# Patient Record
Sex: Male | Born: 1950 | Race: White | Hispanic: No | Marital: Married | Smoking: Former smoker
Health system: Southern US, Community
[De-identification: ages and names within clinical notes are randomized; demographics above are authoritative.]

## PROBLEM LIST (undated history)

## (undated) DIAGNOSIS — I209 Angina pectoris, unspecified: Secondary | ICD-10-CM

## (undated) DIAGNOSIS — E785 Hyperlipidemia, unspecified: Secondary | ICD-10-CM

## (undated) DIAGNOSIS — I10 Essential (primary) hypertension: Secondary | ICD-10-CM

## (undated) DIAGNOSIS — K219 Gastro-esophageal reflux disease without esophagitis: Secondary | ICD-10-CM

## (undated) DIAGNOSIS — I2510 Atherosclerotic heart disease of native coronary artery without angina pectoris: Secondary | ICD-10-CM

## (undated) HISTORY — PX: HALLUX VALGUS CORRECTION: SUR315

## (undated) HISTORY — PX: CARDIAC CATHETERIZATION: SHX172

## (undated) HISTORY — PX: APPENDECTOMY: SHX54

---

## 2018-03-05 ENCOUNTER — Encounter: Payer: Self-pay | Admitting: *Deleted

## 2018-03-08 ENCOUNTER — Ambulatory Visit: Payer: BLUE CROSS/BLUE SHIELD | Admitting: Anesthesiology

## 2018-03-08 ENCOUNTER — Encounter: Payer: Self-pay | Admitting: *Deleted

## 2018-03-08 ENCOUNTER — Encounter
Admission: RE | Disposition: A | Discharge status: Home / Self Care | Payer: Self-pay | Source: Ambulatory Visit | Attending: Unknown Physician Specialty

## 2018-03-08 ENCOUNTER — Other Ambulatory Visit: Payer: Self-pay

## 2018-03-08 ENCOUNTER — Ambulatory Visit
Admission: RE | Admit: 2018-03-08 | Discharge: 2018-03-08 | Disposition: A | Discharge status: Home / Self Care | Payer: BLUE CROSS/BLUE SHIELD | Source: Ambulatory Visit | Attending: Unknown Physician Specialty | Admitting: Unknown Physician Specialty

## 2018-03-08 DIAGNOSIS — I10 Essential (primary) hypertension: Secondary | ICD-10-CM | POA: Diagnosis not present

## 2018-03-08 DIAGNOSIS — K5730 Diverticulosis of large intestine without perforation or abscess without bleeding: Secondary | ICD-10-CM | POA: Diagnosis not present

## 2018-03-08 DIAGNOSIS — Z87891 Personal history of nicotine dependence: Secondary | ICD-10-CM | POA: Diagnosis not present

## 2018-03-08 DIAGNOSIS — Z79899 Other long term (current) drug therapy: Secondary | ICD-10-CM | POA: Insufficient documentation

## 2018-03-08 DIAGNOSIS — Z1211 Encounter for screening for malignant neoplasm of colon: Secondary | ICD-10-CM | POA: Insufficient documentation

## 2018-03-08 DIAGNOSIS — K219 Gastro-esophageal reflux disease without esophagitis: Secondary | ICD-10-CM | POA: Insufficient documentation

## 2018-03-08 DIAGNOSIS — K640 First degree hemorrhoids: Secondary | ICD-10-CM | POA: Insufficient documentation

## 2018-03-08 DIAGNOSIS — K2270 Barrett's esophagus without dysplasia: Secondary | ICD-10-CM | POA: Insufficient documentation

## 2018-03-08 DIAGNOSIS — D120 Benign neoplasm of cecum: Secondary | ICD-10-CM | POA: Insufficient documentation

## 2018-03-08 DIAGNOSIS — E785 Hyperlipidemia, unspecified: Secondary | ICD-10-CM | POA: Diagnosis not present

## 2018-03-08 DIAGNOSIS — Z7982 Long term (current) use of aspirin: Secondary | ICD-10-CM | POA: Diagnosis not present

## 2018-03-08 DIAGNOSIS — I2510 Atherosclerotic heart disease of native coronary artery without angina pectoris: Secondary | ICD-10-CM | POA: Diagnosis not present

## 2018-03-08 HISTORY — PX: ESOPHAGOGASTRODUODENOSCOPY (EGD) WITH PROPOFOL: SHX5813

## 2018-03-08 HISTORY — DX: Atherosclerotic heart disease of native coronary artery without angina pectoris: I25.10

## 2018-03-08 HISTORY — DX: Essential (primary) hypertension: I10

## 2018-03-08 HISTORY — DX: Angina pectoris, unspecified: I20.9

## 2018-03-08 HISTORY — DX: Gastro-esophageal reflux disease without esophagitis: K21.9

## 2018-03-08 HISTORY — PX: COLONOSCOPY WITH PROPOFOL: SHX5780

## 2018-03-08 HISTORY — DX: Hyperlipidemia, unspecified: E78.5

## 2018-03-08 SURGERY — COLONOSCOPY WITH PROPOFOL
Anesthesia: General

## 2018-03-08 MED ORDER — MIDAZOLAM HCL 5 MG/5ML IJ SOLN
INTRAMUSCULAR | Status: DC | PRN
  Administered 2018-03-08: 2 mg via INTRAVENOUS

## 2018-03-08 MED ORDER — FENTANYL CITRATE (PF) 100 MCG/2ML IJ SOLN
INTRAMUSCULAR | Status: DC | PRN
  Administered 2018-03-08 (×2): 50 ug via INTRAVENOUS

## 2018-03-08 MED ORDER — SODIUM CHLORIDE 0.9 % IV SOLN
INTRAVENOUS | Status: DC
  Administered 2018-03-08: 13:00:00 via INTRAVENOUS

## 2018-03-08 MED ORDER — LIDOCAINE HCL (PF) 2 % IJ SOLN
INTRAMUSCULAR | Status: DC | PRN
  Administered 2018-03-08: 80 mg

## 2018-03-08 MED ORDER — GLYCOPYRROLATE 0.2 MG/ML IJ SOLN
INTRAMUSCULAR | Status: DC | PRN
  Administered 2018-03-08: 0.2 mg via INTRAVENOUS

## 2018-03-08 MED ORDER — PROPOFOL 500 MG/50ML IV EMUL
INTRAVENOUS | Status: AC
  Filled 2018-03-08: qty 50

## 2018-03-08 MED ORDER — LIDOCAINE HCL (PF) 2 % IJ SOLN
INTRAMUSCULAR | Status: AC
Start: 2018-03-08 — End: ?
  Filled 2018-03-08: qty 10

## 2018-03-08 MED ORDER — SODIUM CHLORIDE 0.9 % IV SOLN: INTRAVENOUS | Status: DC

## 2018-03-08 MED ORDER — MIDAZOLAM HCL 2 MG/2ML IJ SOLN
INTRAMUSCULAR | Status: AC
  Filled 2018-03-08: qty 2

## 2018-03-08 MED ORDER — GLYCOPYRROLATE 0.2 MG/ML IJ SOLN
INTRAMUSCULAR | Status: AC
  Filled 2018-03-08: qty 1

## 2018-03-08 MED ORDER — PROPOFOL 500 MG/50ML IV EMUL
INTRAVENOUS | Status: DC | PRN
  Administered 2018-03-08: 50 ug/kg/min via INTRAVENOUS

## 2018-03-08 MED ORDER — PROPOFOL 10 MG/ML IV BOLUS
INTRAVENOUS | Status: DC | PRN
  Administered 2018-03-08: 20 mg via INTRAVENOUS
  Administered 2018-03-08: 30 mg via INTRAVENOUS

## 2018-03-08 MED ORDER — FENTANYL CITRATE (PF) 100 MCG/2ML IJ SOLN
INTRAMUSCULAR | Status: AC
  Filled 2018-03-08: qty 2

## 2018-03-08 NOTE — Anesthesia Postprocedure Evaluation (Signed)
Anesthesia Post Note  Patient: Derrick Monroe  Procedure(s) Performed: COLONOSCOPY WITH PROPOFOL (N/A ) ESOPHAGOGASTRODUODENOSCOPY (EGD) WITH PROPOFOL (N/A )  Patient location during evaluation: Endoscopy Anesthesia Type: General Level of consciousness: awake and alert Pain management: pain level controlled Vital Signs Assessment: post-procedure vital signs reviewed and stable Respiratory status: spontaneous breathing, nonlabored ventilation, respiratory function stable and patient connected to nasal cannula oxygen Cardiovascular status: blood pressure returned to baseline and stable Postop Assessment: no apparent nausea or vomiting Anesthetic complications: no     Last Vitals:  Vitals:   03/08/18 1350 03/08/18 1400  BP: 135/75 127/89  Pulse: 64 (!) 57  Resp: 14 17  Temp:    SpO2: 98% 98%    Last Pain:  Vitals:   03/08/18 1400  TempSrc:   PainSc: 0-No pain                 Martha Clan

## 2018-03-08 NOTE — Anesthesia Post-op Follow-up Note (Signed)
Anesthesia QCDR form completed.        

## 2018-03-08 NOTE — Op Note (Signed)
[name] Gastroenterology Patient Name: Derrick Monroe Procedure Date: 03/08/2018 12:06 PM MRN: [id number] Account #: [id number] Date of Birth: 12/03/1950 Admit Type: Outpatient Age: 67 Room: [facility] ENDO ROOM 3 Gender: Male Note Status: Finalized Procedure:            Colonoscopy Indications:          Screening for colorectal malignant neoplasm Providers:            Manya Silvas, MD Medicines:            Propofol per Anesthesia Complications:        No immediate complications. Procedure:            Pre-Anesthesia Assessment:                       - After reviewing the risks and benefits, the patient                        was deemed in satisfactory condition to undergo the                        procedure.                       After obtaining informed consent, the colonoscope was                        passed under direct vision. Throughout the procedure,                        the patient's blood pressure, pulse, and oxygen                        saturations were monitored continuously. The                        Colonoscope was introduced through the anus and                        advanced to the the cecum, identified by appendiceal                        orifice and ileocecal valve. The colonoscopy was                        performed without difficulty. The patient tolerated the                        procedure well. The quality of the bowel preparation                        was excellent. Findings:      A small polyp was found in the cecum. The polyp was sessile. The polyp       was removed with a hot snare. Resection and retrieval were complete.      A patchy area of moderately erythematous mucosa was found in the       recto-sigmoid colon and in the sigmoid colon.      Internal hemorrhoids were found during endoscopy. The hemorrhoids were       small and Grade I (internal hemorrhoids that do not prolapse).  Many small-mouthed diverticula were  found in the sigmoid colon and       descending colon. Impression:           - One small polyp in the cecum, removed with a hot                        snare. Resected and retrieved.                       - Erythematous mucosa in the recto-sigmoid colon and in                        the sigmoid colon.                       - Internal hemorrhoids.                       - Diverticulosis in the sigmoid colon and in the                        descending colon. Recommendation:       - Await pathology results. Manya Silvas, MD 03/08/2018 1:41:57 PM This report has been signed electronically. Number of Addenda: 0 Note Initiated On: 03/08/2018 12:06 PM Scope Withdrawal Time: 0 hours 11 minutes 35 seconds  Total Procedure Duration: 0 hours 14 minutes 32 seconds       [facility]

## 2018-03-08 NOTE — H&P (Signed)
Primary Care Physician:  Adin Hector, MD Primary Gastroenterologist:  Dr. Vira Agar  Pre-Procedure History & Physical: HPI:  Derrick Monroe is a 67 y.o. male is here for an endoscopy and colonoscopy.  Colon cancer screening and GERD.   Past Medical History:  Diagnosis Date  . Anginal pain ([place])   . Coronary artery disease   . GERD (gastroesophageal reflux disease)   . Hyperlipidemia   . Hypertension     Past Surgical History:  Procedure Laterality Date  . APPENDECTOMY    . CARDIAC CATHETERIZATION    . HALLUX VALGUS CORRECTION      Prior to Admission medications   Medication Sig Start Date End Date Taking? Authorizing Provider  aspirin EC 81 MG tablet Take 81 mg by mouth daily.   Yes [provider]  finasteride (PROSCAR) 5 MG tablet Take 5 mg by mouth daily.   Yes [provider]  Glucosamine Sulfate 1000 MG CAPS Take by mouth.   Yes [provider]  metoprolol succinate (TOPROL-XL) 25 MG 24 hr tablet Take 25 mg by mouth daily.   Yes [provider]  Multiple Vitamin (MULTIVITAMIN) tablet Take 1 tablet by mouth daily.   Yes [provider]  OMEGA-3 FATTY ACIDS PO Take 1,200 mg by mouth daily.   Yes [provider]  pantoprazole (PROTONIX) 40 MG tablet Take 40 mg by mouth daily.   Yes [provider]  rosuvastatin (CRESTOR) 40 MG tablet Take 40 mg by mouth daily.   Yes [provider]  polyethylene glycol powder (GLYCOLAX/MIRALAX) powder Take 1 Container by mouth once.    [provider]    Allergies as of 01/18/2018  . (Not on File)    History reviewed. No pertinent family history.  Social History   Socioeconomic History  . Marital status: Married    Spouse name: Not on file  . Number of children: Not on file  . Years of education: Not on file  . Highest education level: Not on file  Occupational History  . Not on file  Social Needs  . Financial resource strain: Not on file  .  Food insecurity:    Worry: Not on file    Inability: Not on file  . Transportation needs:    Medical: Not on file    Non-medical: Not on file  Tobacco Use  . Smoking status: Former Research scientist (life sciences)  . Smokeless tobacco: Never Used  Substance and Sexual Activity  . Alcohol use: Yes    Alcohol/week: 1.2 oz    Types: 2 Cans of beer per week  . Drug use: Never  . Sexual activity: Yes  Lifestyle  . Physical activity:    Days per week: Not on file    Minutes per session: Not on file  . Stress: Not on file  Relationships  . Social connections:    Talks on phone: Not on file    Gets together: Not on file    Attends religious service: Not on file    Active member of club or organization: Not on file    Attends meetings of clubs or organizations: Not on file    Relationship status: Not on file  . Intimate partner violence:    Fear of current or ex partner: Not on file    Emotionally abused: Not on file    Physically abused: Not on file    Forced sexual activity: Not on file  Other Topics Concern  . Not on file  Social History Narrative  . Not on file    Review of Systems: See HPI, otherwise negative ROS  Physical Exam: BP (!) 145/73   Pulse (!) 54   Temp (!) 97.4 F (36.3 C) (Tympanic)   Resp 16   Ht 6' (1.829 m)   Wt 81.6 kg (180 lb)   SpO2 98%   BMI 24.41 kg/m  General:   Alert,  pleasant and cooperative in NAD Head:  Normocephalic and atraumatic. Neck:  Supple; no masses or thyromegaly. Lungs:  Clear throughout to auscultation.    Heart:  Regular rate and rhythm. Abdomen:  Soft, nontender and nondistended. Normal bowel sounds, without guarding, and without rebound.   Neurologic:  Alert and  oriented x4;  grossly normal neurologically.  Impression/Plan: Derrick Monroe is here for an endoscopy and colonoscopy to be performed for colon cancer screening and GERD  Risks, benefits, limitations, and alternatives regarding  endoscopy and colonoscopy have been reviewed with the  patient.  Questions have been answered.  All parties agreeable.   Gaylyn Cheers, MD  03/08/2018, 1:05 PM

## 2018-03-08 NOTE — Transfer of Care (Signed)
Immediate Anesthesia Transfer of Care Note  Patient: Derrick Monroe  Procedure(s) Performed: COLONOSCOPY WITH PROPOFOL (N/A ) ESOPHAGOGASTRODUODENOSCOPY (EGD) WITH PROPOFOL (N/A )  Patient Location: PACU  Anesthesia Type:General  Level of Consciousness: sedated  Airway & Oxygen Therapy: Patient Spontanous Breathing and Patient connected to nasal cannula oxygen  Post-op Assessment: Report given to RN and Post -op Vital signs reviewed and stable  Post vital signs: Reviewed and stable  Last Vitals:  Vitals Value Taken Time  BP 119/70 03/08/2018  1:39 PM  Temp    Pulse 64 03/08/2018  1:39 PM  Resp 16 03/08/2018  1:39 PM  SpO2 97 % 03/08/2018  1:39 PM  Vitals shown include unvalidated device data.  Last Pain:  Vitals:   03/08/18 1340  TempSrc: (P) Tympanic  PainSc:          Complications: No apparent anesthesia complications

## 2018-03-08 NOTE — Op Note (Signed)
[facility] Patient Name: Derrick Monroe Procedure Date: 03/08/2018 12:47 PM MRN: [id number] Account #: [id number] Date of Birth: 1951/06/14 Admit Type: Outpatient Age: 67 Room: [facility] ENDO ROOM 3 Gender: Male Note Status: Finalized Procedure:            Upper GI endoscopy Indications:          Heartburn, Suspected gastro-esophageal reflux disease Providers:            Manya Silvas, MD Referring MD:         Ramonita Lab, MD (Referring MD) Medicines:            Propofol per Anesthesia Complications:        No immediate complications. Procedure:            Pre-Anesthesia Assessment:                       - After reviewing the risks and benefits, the patient                        was deemed in satisfactory condition to undergo the                        procedure.                       After obtaining informed consent, the endoscope was                        passed under direct vision. Throughout the procedure,                        the patient's blood pressure, pulse, and oxygen                        saturations were monitored continuously. The Endoscope                        was introduced through the mouth, and advanced to the                        second part of duodenum. The upper GI endoscopy was                        accomplished without difficulty. The patient tolerated                        the procedure well. Findings:      There were esophageal mucosal changes suspicious for short-segment       Barrett's esophagus present in the distal esophagus. GEJ at 42cm. The       maximum longitudinal extent of these mucosal changes was 2 cm in length.       Biopsies were taken with a cold forceps for histology.      The stomach was normal.      The examined duodenum was normal. Impression:           - Esophageal mucosal changes suspicious for                        short-segment Barrett's esophagus. Biopsied.                       -  Normal  stomach.                       - Normal examined duodenum. Recommendation:       - Await pathology results. Manya Silvas, MD 03/08/2018 1:18:51 PM This report has been signed electronically. Number of Addenda: 0 Note Initiated On: 03/08/2018 12:47 PM      [facility]

## 2018-03-08 NOTE — Anesthesia Preprocedure Evaluation (Signed)
Anesthesia Evaluation  Patient identified by MRN, date of birth, ID band Patient awake    Reviewed: Allergy & Precautions, H&P , NPO status , Patient's Chart, lab work & pertinent test results, reviewed documented beta blocker date and time   History of Anesthesia Complications Negative for: history of anesthetic complications  Airway Mallampati: II  TM Distance: >3 FB Neck ROM: full    Dental  (+) Caps, Dental Advidsory Given, Teeth Intact   Pulmonary neg shortness of breath, neg sleep apnea, neg COPD, neg recent URI, former smoker,           Cardiovascular Exercise Tolerance: Good hypertension, (-) angina+ CAD and + Cardiac Stents  (-) Past MI and (-) CABG (-) dysrhythmias (-) Valvular Problems/Murmurs     Neuro/Psych negative neurological ROS  negative psych ROS   GI/Hepatic Neg liver ROS, GERD  ,  Endo/Other  negative endocrine ROS  Renal/GU negative Renal ROS  negative genitourinary   Musculoskeletal   Abdominal   Peds  Hematology negative hematology ROS (+)   Anesthesia Other Findings Past Medical History: No date: Coronary artery disease No date: GERD (gastroesophageal reflux disease) No date: Hyperlipidemia No date: Hypertension   Reproductive/Obstetrics negative OB ROS                             Anesthesia Physical Anesthesia Plan  ASA: II  Anesthesia Plan: General   Post-op Pain Management:    Induction: Intravenous  PONV Risk Score and Plan: 2 and Propofol infusion  Airway Management Planned: Nasal Cannula  Additional Equipment:   Intra-op Plan:   Post-operative Plan:   Informed Consent: I have reviewed the patients History and Physical, chart, labs and discussed the procedure including the risks, benefits and alternatives for the proposed anesthesia with the patient or authorized representative who has indicated his/her understanding and acceptance.    Dental Advisory Given  Plan Discussed with: Anesthesiologist, CRNA and Surgeon  Anesthesia Plan Comments:         Anesthesia Quick Evaluation

## 2018-03-09 LAB — SURGICAL PATHOLOGY

## 2018-03-10 ENCOUNTER — Encounter: Payer: Self-pay | Admitting: Unknown Physician Specialty

## 2019-11-12 ENCOUNTER — Ambulatory Visit: Payer: Medicare Other | Attending: Internal Medicine

## 2019-11-12 DIAGNOSIS — Z23 Encounter for immunization: Secondary | ICD-10-CM | POA: Insufficient documentation

## 2019-11-12 NOTE — Progress Notes (Signed)
   Covid-19 Vaccination Clinic  Name:  Derrick Monroe    MRN: [id number] DOB: August 27, 1951  11/12/2019  [name] was observed post Covid-19 immunization for 15 minutes without incidence. He was provided with Vaccine Information Sheet and instruction to access the V-Safe system.   [name] was instructed to call 911 with any severe reactions post vaccine: Marland Kitchen Difficulty breathing  . Swelling of your face and throat  . A fast heartbeat  . A bad rash all over your body  . Dizziness and weakness    Immunizations Administered    Name Date Dose VIS Date Route   Pfizer COVID-19 Vaccine 11/12/2019  2:23 PM 0.3 mL 09/02/2019 Intramuscular   Manufacturer: [place]   Lot: [id number]   [place]: [id number]

## 2019-12-06 ENCOUNTER — Ambulatory Visit: Payer: Medicare Other

## 2019-12-06 ENCOUNTER — Ambulatory Visit: Payer: Medicare Other | Attending: Internal Medicine

## 2019-12-06 DIAGNOSIS — Z23 Encounter for immunization: Secondary | ICD-10-CM

## 2019-12-06 NOTE — Progress Notes (Signed)
   Covid-19 Vaccination Clinic  Name:  Won Sipin    MRN: [id number] DOB: [birth year]  12/06/2019  Mr. Polson was observed post Covid-19 immunization for 15 minutes without incident. He was provided with Vaccine Information Sheet and instruction to access the V-Safe system.   Mr. Frymier was instructed to call 911 with any severe reactions post vaccine: Marland Kitchen Difficulty breathing  . Swelling of face and throat  . A fast heartbeat  . A bad rash all over body  . Dizziness and weakness   Immunizations Administered    Name Date Dose VIS Date Route   Pfizer COVID-19 Vaccine 12/06/2019 11:37 AM 0.3 mL 09/02/2019 Intramuscular   Manufacturer: [place]   Lot: [id number]   [place]: [id number]

## 2020-07-19 ENCOUNTER — Other Ambulatory Visit: Payer: Self-pay | Admitting: Internal Medicine

## 2020-07-19 ENCOUNTER — Other Ambulatory Visit (HOSPITAL_COMMUNITY): Payer: Self-pay | Admitting: Internal Medicine

## 2020-07-19 DIAGNOSIS — Z87891 Personal history of nicotine dependence: Secondary | ICD-10-CM

## 2020-07-19 DIAGNOSIS — I10 Essential (primary) hypertension: Secondary | ICD-10-CM

## 2020-07-26 ENCOUNTER — Ambulatory Visit
Admission: RE | Admit: 2020-07-26 | Discharge: 2020-07-26 | Disposition: A | Discharge status: Home / Self Care | Payer: Medicare Other | Source: Ambulatory Visit | Attending: Internal Medicine | Admitting: Internal Medicine

## 2020-07-26 ENCOUNTER — Other Ambulatory Visit: Payer: Self-pay

## 2020-07-26 DIAGNOSIS — Z87891 Personal history of nicotine dependence: Secondary | ICD-10-CM | POA: Diagnosis present

## 2020-07-26 DIAGNOSIS — I10 Essential (primary) hypertension: Secondary | ICD-10-CM | POA: Insufficient documentation

## 2021-04-11 IMAGING — US US ABDOMINAL AORTA SCREENING AAA
1 series · 14 of 23 positions shown · non-contrast
Comparison: None.

CLINICAL DATA: 69-year-old male with hypertension, smoking history.
Undergoing screening for AAA.

EXAM:
US ABDOMINAL AORTA MEDICARE SCREENING
TECHNIQUE: Ultrasound examination of the abdominal aorta was performed as a
screening evaluation for abdominal aortic aneurysm.

[Series 1: us aorta medicare screening · 14 of 23 slices shown]
[im 1/23]
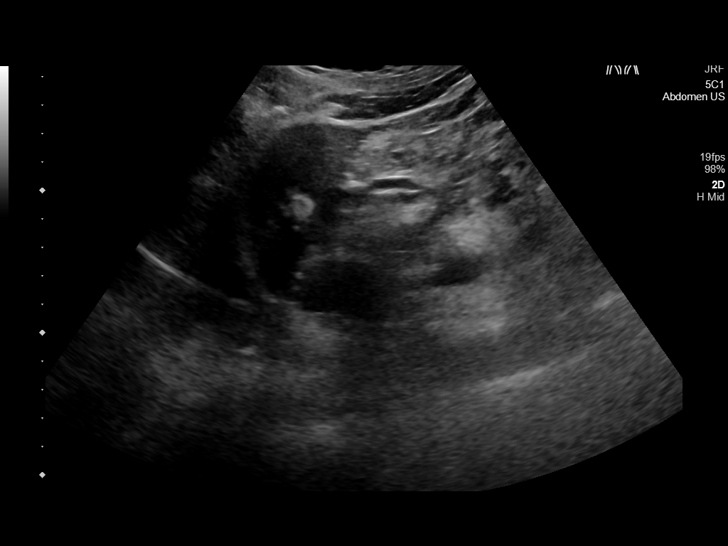
[im 3/23]
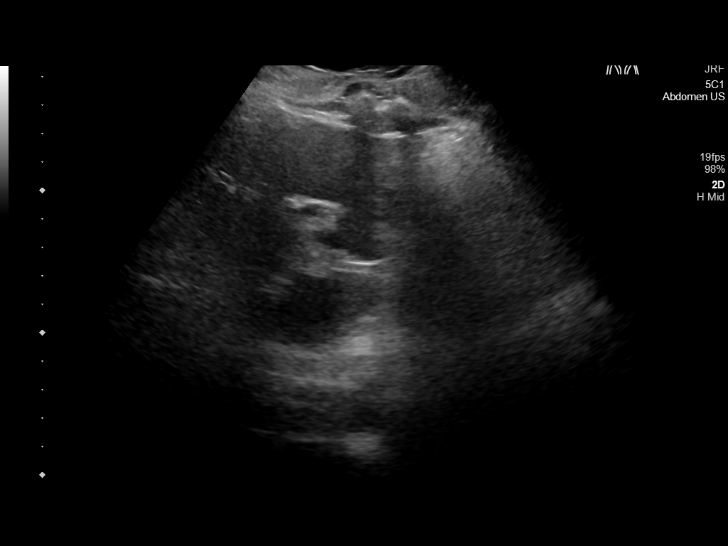
[im 5/23]
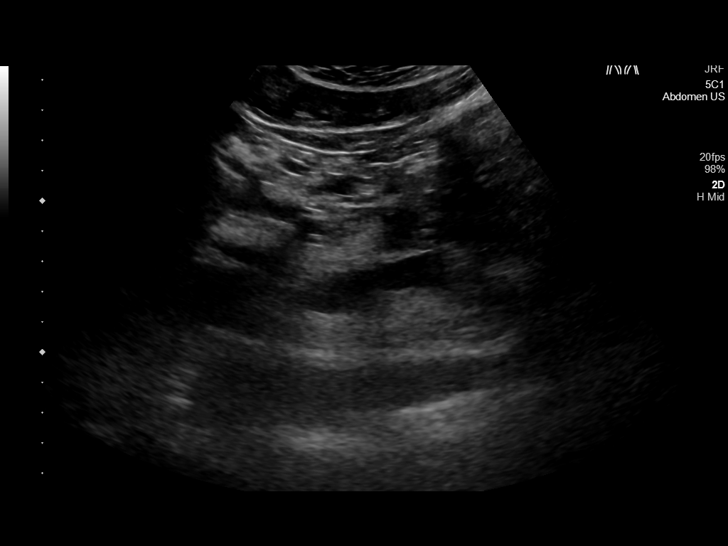
[im 6/23]
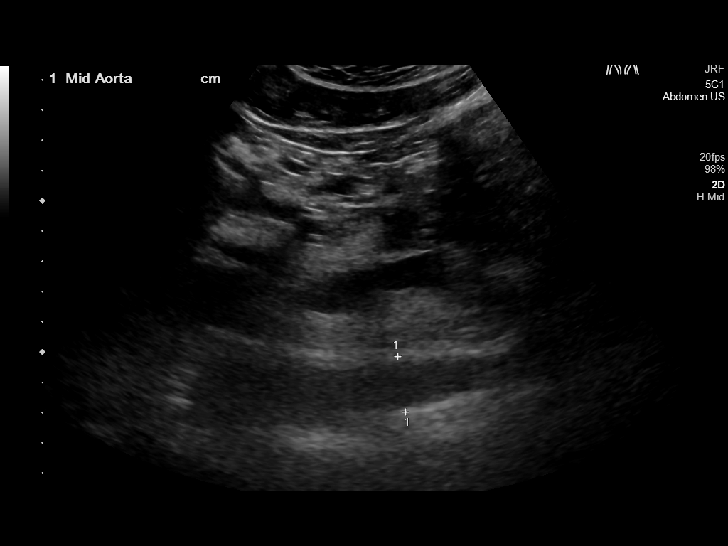
[im 8/23]
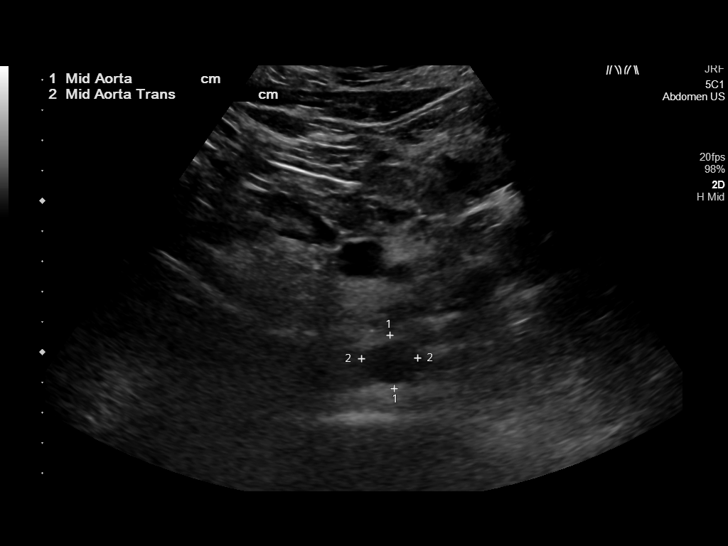
[im 10/23]
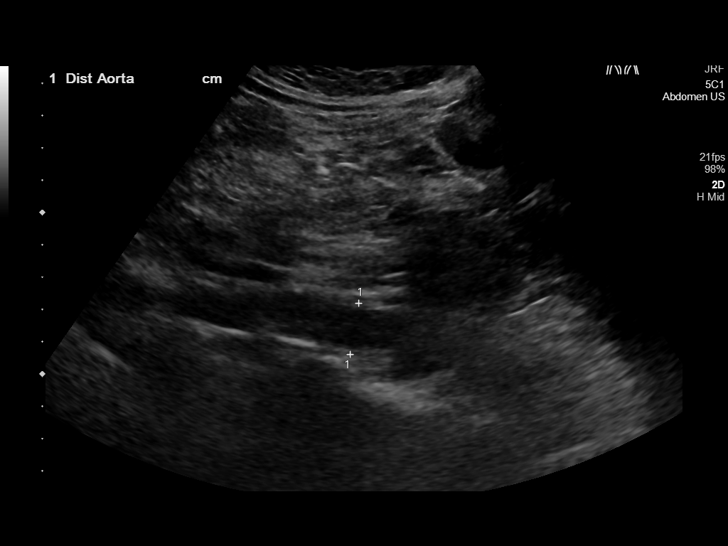
[im 11/23]
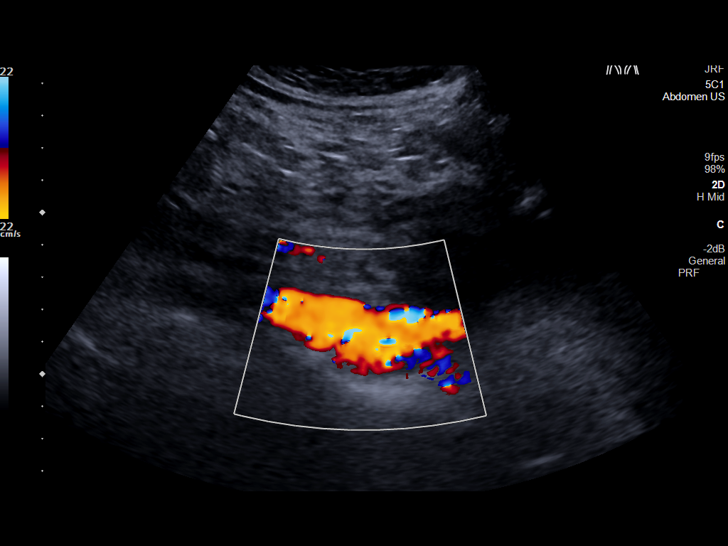
[im 13/23]
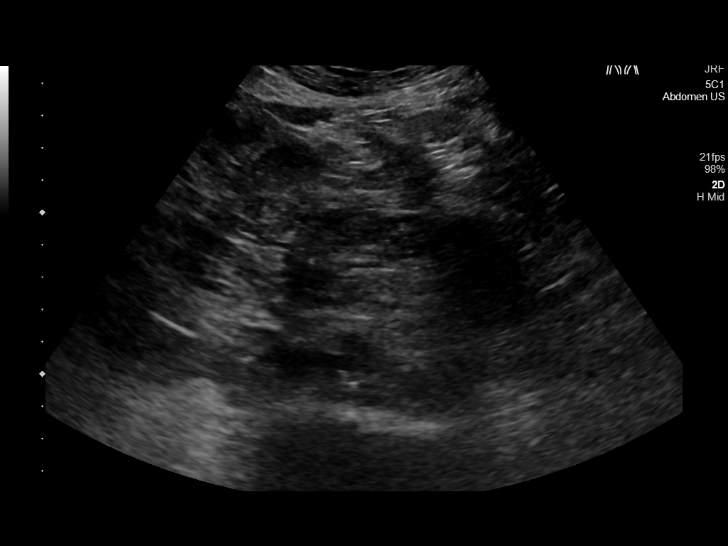
[im 14/23]
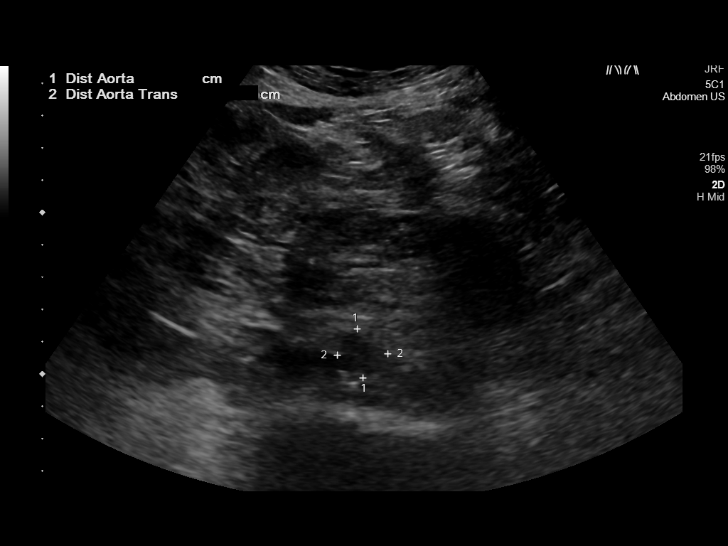
[im 16/23]
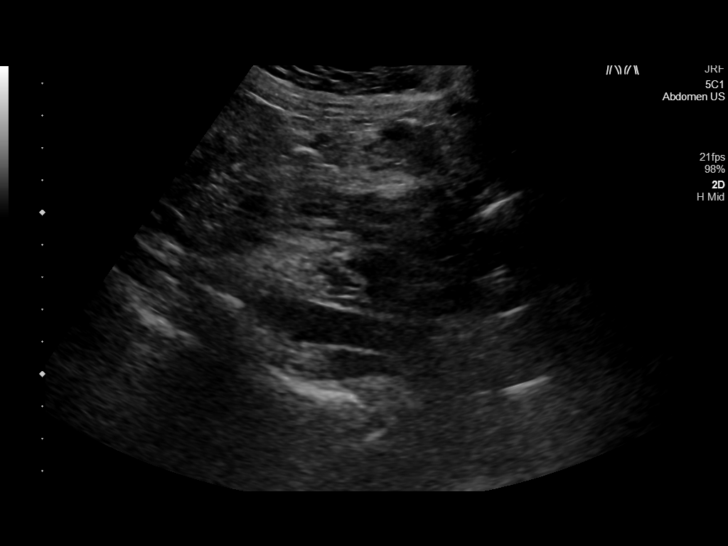
[im 18/23]
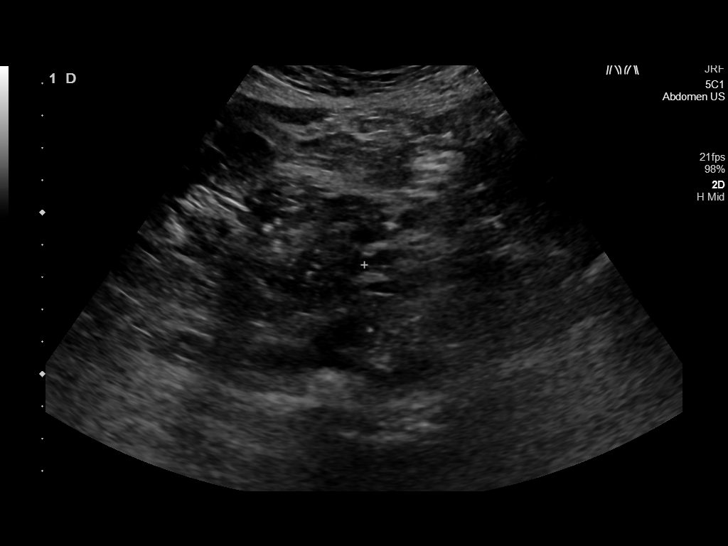
[im 19/23]
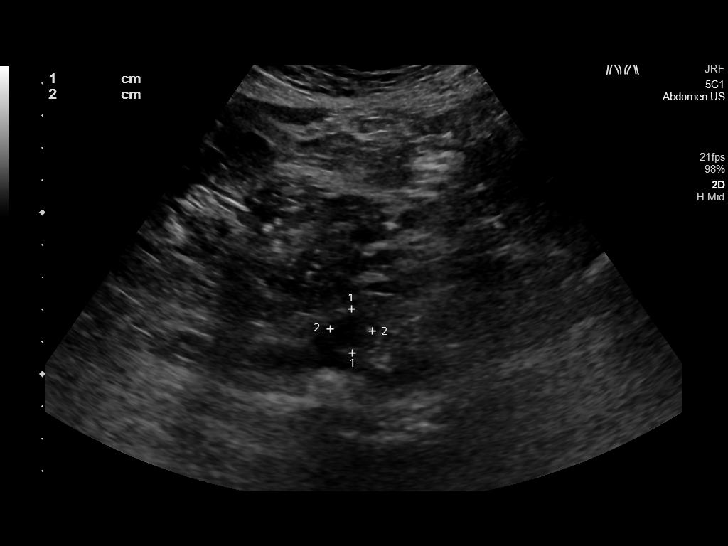
[im 21/23]
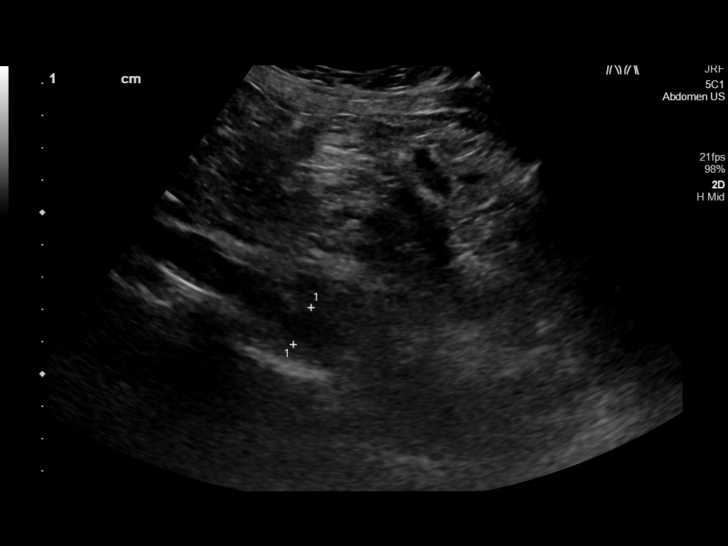
[im 23/23]
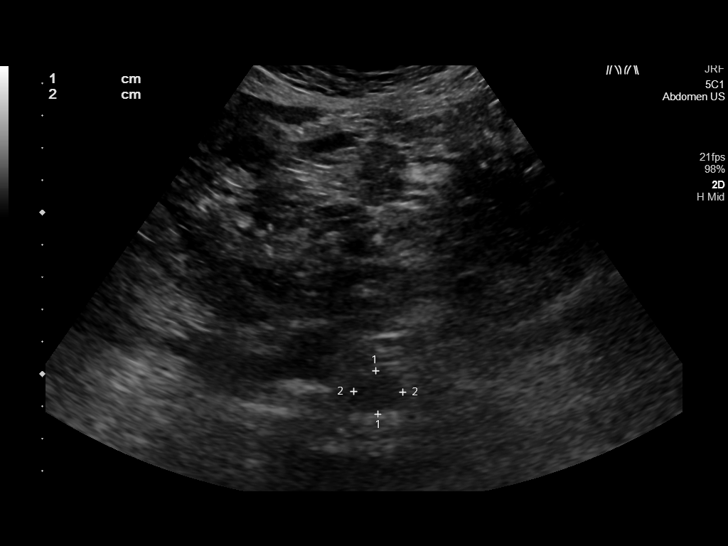

[14 of 23 positions shown; findings below may reference images not displayed]

FINDINGS: Abdominal aortic measurements as follows:

Proximal:  2.2 x 2.0 cm (AP by transverse)

Mid:         1.9 x 1.9 cm

Distal:     1.6 x 1.6 cm
IMPRESSION: Negative for abdominal aortic aneurysm.
